# Patient Record
Sex: Female | Born: 1965 | Race: Black or African American | Hispanic: No | Marital: Single | Smoking: Never smoker
Health system: Southern US, Community
[De-identification: ages and names within clinical notes are randomized; demographics above are authoritative.]

## PROBLEM LIST (undated history)

## (undated) DIAGNOSIS — I8390 Asymptomatic varicose veins of unspecified lower extremity: Secondary | ICD-10-CM

## (undated) DIAGNOSIS — E538 Deficiency of other specified B group vitamins: Secondary | ICD-10-CM

## (undated) DIAGNOSIS — E669 Obesity, unspecified: Secondary | ICD-10-CM

## (undated) DIAGNOSIS — M722 Plantar fascial fibromatosis: Secondary | ICD-10-CM

## (undated) DIAGNOSIS — J309 Allergic rhinitis, unspecified: Secondary | ICD-10-CM

## (undated) DIAGNOSIS — I10 Essential (primary) hypertension: Secondary | ICD-10-CM

## (undated) HISTORY — DX: Plantar fascial fibromatosis: M72.2

## (undated) HISTORY — PX: WISDOM TOOTH EXTRACTION: SHX21

## (undated) HISTORY — DX: Essential (primary) hypertension: I10

## (undated) HISTORY — DX: Deficiency of other specified B group vitamins: E53.8

## (undated) HISTORY — DX: Obesity, unspecified: E66.9

## (undated) HISTORY — DX: Asymptomatic varicose veins of unspecified lower extremity: I83.90

## (undated) HISTORY — DX: Allergic rhinitis, unspecified: J30.9

---

## 1997-05-06 ENCOUNTER — Other Ambulatory Visit: Admission: RE | Admit: 1997-05-06 | Discharge: 1997-05-06 | Payer: Self-pay | Admitting: Obstetrics & Gynecology

## 1998-04-18 ENCOUNTER — Other Ambulatory Visit: Admission: RE | Admit: 1998-04-18 | Discharge: 1998-04-18 | Payer: Self-pay | Admitting: Obstetrics and Gynecology

## 1999-05-07 ENCOUNTER — Other Ambulatory Visit: Admission: RE | Admit: 1999-05-07 | Discharge: 1999-05-07 | Payer: Self-pay | Admitting: Obstetrics and Gynecology

## 1999-09-21 ENCOUNTER — Emergency Department (HOSPITAL_COMMUNITY): Admission: EM | Admit: 1999-09-21 | Discharge: 1999-09-21 | Payer: Self-pay | Admitting: *Deleted

## 1999-10-01 ENCOUNTER — Emergency Department (HOSPITAL_COMMUNITY): Admission: EM | Admit: 1999-10-01 | Discharge: 1999-10-01 | Payer: Self-pay | Admitting: *Deleted

## 2000-05-19 ENCOUNTER — Other Ambulatory Visit: Admission: RE | Admit: 2000-05-19 | Discharge: 2000-05-19 | Payer: Self-pay | Admitting: Obstetrics and Gynecology

## 2001-06-14 ENCOUNTER — Other Ambulatory Visit: Admission: RE | Admit: 2001-06-14 | Discharge: 2001-06-14 | Payer: Self-pay | Admitting: Obstetrics and Gynecology

## 2002-07-06 ENCOUNTER — Other Ambulatory Visit: Admission: RE | Admit: 2002-07-06 | Discharge: 2002-07-06 | Payer: Self-pay | Admitting: Obstetrics and Gynecology

## 2003-10-25 ENCOUNTER — Other Ambulatory Visit: Admission: RE | Admit: 2003-10-25 | Discharge: 2003-10-25 | Payer: Self-pay | Admitting: Obstetrics and Gynecology

## 2004-12-16 ENCOUNTER — Other Ambulatory Visit: Admission: RE | Admit: 2004-12-16 | Discharge: 2004-12-16 | Payer: Self-pay | Admitting: Obstetrics and Gynecology

## 2006-02-03 ENCOUNTER — Ambulatory Visit (HOSPITAL_COMMUNITY): Admission: RE | Admit: 2006-02-03 | Discharge: 2006-02-03 | Payer: Self-pay | Admitting: Obstetrics and Gynecology

## 2007-02-07 ENCOUNTER — Ambulatory Visit (HOSPITAL_COMMUNITY): Admission: RE | Admit: 2007-02-07 | Discharge: 2007-02-07 | Payer: Self-pay | Admitting: Obstetrics and Gynecology

## 2008-03-21 ENCOUNTER — Ambulatory Visit (HOSPITAL_COMMUNITY): Admission: RE | Admit: 2008-03-21 | Discharge: 2008-03-21 | Payer: Self-pay | Admitting: Obstetrics and Gynecology

## 2008-05-31 ENCOUNTER — Encounter (INDEPENDENT_AMBULATORY_CARE_PROVIDER_SITE_OTHER): Payer: Self-pay | Admitting: Podiatry

## 2008-05-31 ENCOUNTER — Ambulatory Visit: Payer: Self-pay | Admitting: Vascular Surgery

## 2008-05-31 ENCOUNTER — Ambulatory Visit (HOSPITAL_COMMUNITY): Admission: RE | Admit: 2008-05-31 | Discharge: 2008-05-31 | Payer: Self-pay | Admitting: Podiatry

## 2009-02-11 ENCOUNTER — Encounter: Admission: RE | Admit: 2009-02-11 | Discharge: 2009-02-11 | Payer: Self-pay | Admitting: Orthopedic Surgery

## 2009-03-10 ENCOUNTER — Ambulatory Visit: Payer: Self-pay | Admitting: Surgery

## 2009-03-17 ENCOUNTER — Ambulatory Visit: Payer: Self-pay | Admitting: Surgery

## 2009-03-17 ENCOUNTER — Encounter: Admission: RE | Admit: 2009-03-17 | Discharge: 2009-03-17 | Payer: Self-pay | Admitting: Surgery

## 2009-03-25 ENCOUNTER — Ambulatory Visit (HOSPITAL_COMMUNITY): Admission: RE | Admit: 2009-03-25 | Discharge: 2009-03-25 | Payer: Self-pay | Admitting: Obstetrics and Gynecology

## 2010-04-21 ENCOUNTER — Other Ambulatory Visit (HOSPITAL_COMMUNITY): Payer: Self-pay | Admitting: Obstetrics and Gynecology

## 2010-04-21 DIAGNOSIS — Z1231 Encounter for screening mammogram for malignant neoplasm of breast: Secondary | ICD-10-CM

## 2010-05-26 NOTE — Procedures (Signed)
DUPLEX DEEP VENOUS EXAM - LOWER EXTREMITY   INDICATION:  Left lower extremity edema.   HISTORY:  Edema:  Yes.  Trauma/Surgery:  No.  Pain:  No.  PE:  No.  Previous DVT:  No.  Anticoagulants:  Other:   DUPLEX EXAM:                CFV   SFV   PopV  PTV    GSV                R  L  R  L  R  L  R   L  R  L  Thrombosis    o  o     o     o      o     o  Spontaneous   +  +     +     +      +     +  Phasic        +  +     +     +      +     +  Augmentation  +  +     +     +      +     +  Compressible  +  +     +     +      +     +  Competent     +  +     +     +      +     +   Legend:  + - yes  o - no  p - partial  D - decreased   IMPRESSION:  1. No evidence of deep venous thrombosis and/or reflux noted in the      left leg.  2. Bilateral common femoral veins appear to have normal phasic flow.    _____________________________  V. Charlena Cross, MD   MG/MEDQ  D:  03/10/2009  T:  03/11/2009  Job:  [id number]

## 2010-05-26 NOTE — Assessment & Plan Note (Signed)
OFFICE VISIT   Olivia Powell, Olivia Powell  DOB:  [birth year]                                       03/10/2009  QMVHQ#:[id number]   REFERRING PHYSICIAN:  Dr. Lestine Box.   HISTORY:  This is a very pleasant 45 year old female I am seeing at the  request of Dr. Lestine Box for evaluation of left leg swelling.  The patient  states that this began occurring in April 2010.  There were no  associated factors with this.  She denies having any trauma.  She was  initially seen by a family medicine doctor, who started her on Lasix.  She did have some improvement in the swelling with Lasix; however, due  to the polyuria, she stopped taking this.  She was then seen by a  podiatrist and ultimately Dr. Lestine Box.  She is sent over to me for  further evaluation.  She has had an ultrasound that was negative for  deep vein thrombosis.  She has also been fitted for knee-high  compression stockings, which do help.  There are no history of blood  clots in the family.   REVIEW OF SYSTEMS:  GENERAL:  Positive for recent weight gain.  CARDIAC:  Negative.  PULMONARY:  Positive for cough.  GI:  Positive for reflux, constipation.  GU:  Negative.  VASCULAR:  Positive for pain in her feet when lying flat.  NEURO:  Negative.  MUSCULOSKELETAL:  Negative.  PSYCH:  Negative.  ENT:  Negative.  HEME:  Negative.  SKIN:  Rash in her neck and arm.   PAST MEDICAL HISTORY:  Left leg swelling.   PAST SURGICAL HISTORY:  Wisdom teeth in 1988.   FAMILY HISTORY:  Negative for cardiovascular disease at an early age.  Her father did have a coronary stent placed.   SOCIAL HISTORY:  She is single without children.  She is a Engineer, building services.  Does not smoke, does not drink.   PHYSICAL EXAMINATION:  Heart rate 78, blood pressure 127/87, temperature  is 98.2.  general:  She is well-appearing in no distress.  HEENT:  Within normal limits.  Lungs are clear bilaterally.  Cardiovascular:  Regular rate and rhythm.   Abdomen is obese.  Musculoskeletal:  She has  significant pitting edema in her left leg.  Both feet are warm and well-  perfused.  There is minimal swelling in the right leg.  Skin:  Without  rash.   DIAGNOSTIC STUDIES:  Venous duplex was performed today.  There is no  evidence of DVT.  There is no evidence of reflux in her deep or  superficial systems on the left.  There is normal phasic flow in the  left common femoral vein.   ASSESSMENT:  Left leg swelling.   PLAN:  In talking with the patient, we discussed that left leg swelling  could potentially represent May-Thurner syndrome.  The best way to  evaluate for this would be a CT venogram.  The other possible  explanations would be venous reflux.  She did undergo a reflux  examination today, which was normal.  Interestingly, her waveforms in  the common femoral vein were also normal, which would argue against May-  Thurner syndrome.  Regardless, I think this needs to be excluded.  If  there is a compression of the left iliac vein from the right iliac  artery, she would be  a candidate for an intervention.  In the meantime,  I have encouraged her to wear her stockings and to keep her leg elevated  whenever possible.  We will get the CT scan scheduled, and she will see  me back in a month.     Jorge Ny, MD  Electronically Signed   VWB/MEDQ  D:  03/10/2009  T:  03/11/2009  Job:  [id number]   cc:   Dr. Lestine Box

## 2010-05-26 NOTE — Assessment & Plan Note (Signed)
OFFICE VISIT   ICESIS, RENN  DOB:  07/08/1965                                       03/17/2009  JYNWG#:[id number]   Patient returns today for further follow-up of her left leg swelling.  Again, she has been having trouble with left leg swelling since April,  2010.  She has tried Lasix.  She has tried compression stockings with  compression stockings that provided her with some benefit.  In the  office last week, she underwent a venous duplex which was negative for  reflux.  Since it was limited to her left leg, I was entertaining the  possibility of May-Thurner syndrome.  She did not have an obstructive  wave pattern in her left femoral vein; however, I felt it would be best  to order a CT scan.  She comes back in today to discuss the results.  She has had no change in her symptoms.   PHYSICAL EXAMINATION:  Heart rate 92, blood pressure 134/82, temperature  is 98.0.  General:  She is well-appearing in no distress.  Respirations  are nonlabored.  Left leg continues to show edema up to the knee.   DIAGNOSTIC STUDIES:  I have independently reviewed her CT scan.  This  was a normal scan.  There was no evidence of May-Thurner syndrome.   ASSESSMENT/PLAN:  Left leg swelling.   PLAN:  I think based on the workup this most likely represents  lymphedema.  It could have been caused by trauma.  However, there was no  traumatic event that could be documented.  In any event, we discussed  putting her on lifelong compression therapy.  I am going to refer to a  lymphedema therapist.  I have told her the importance of wearing her  compression stockings to prevent long-term complications such as  ulceration.  She will follow with me on a p.r.n. basis.     Jorge Ny, MD  Electronically Signed   VWB/MEDQ  D:  03/17/2009  T:  03/18/2009  Job:  [id number]   cc:   Dr. Lestine Box

## 2010-06-04 ENCOUNTER — Ambulatory Visit (HOSPITAL_COMMUNITY)
Admission: RE | Admit: 2010-06-04 | Discharge: 2010-06-04 | Disposition: A | Discharge status: Home / Self Care | Payer: BC Managed Care – PPO | Source: Ambulatory Visit | Attending: Obstetrics and Gynecology | Admitting: Obstetrics and Gynecology

## 2010-06-04 DIAGNOSIS — Z1231 Encounter for screening mammogram for malignant neoplasm of breast: Secondary | ICD-10-CM | POA: Insufficient documentation

## 2010-06-28 IMAGING — CT CT ANGIO AOBIFEM WO/W CM
2 of 9 series · 9 of 33 positions shown · IV contrast ([ID] OMNI 350)
Comparison: None.

CLINICAL DATA: Chronic edema of left lower extremity.  Evaluate
for Nilsen syndrome.

CT ANGIOGRAPHY OF ABDOMINAL AORTA WITH ILIOFEMORAL RUNOFF
TECHNIQUE: Multidetector CT imaging of the abdomen, pelvis and
lower extremities was performed using the standard protocol during
bolus administration of intravenous contrast.  Multiplanar CT image
reconstructions including MIPs were obtained to evaluate the
vascular anatomy.  Given clinical indication, both arterial and
venous phase imaging was performed with reconstructions performed
for both phases.
Contrast:  165 ml Omnipaque 350 IV

[Series 5: arterial, venous · axial · arterial · 0.82mm/px · z∈[-1151,-161]mm · 7 of 416 slices shown]
[im 52/416  soft-tissue]
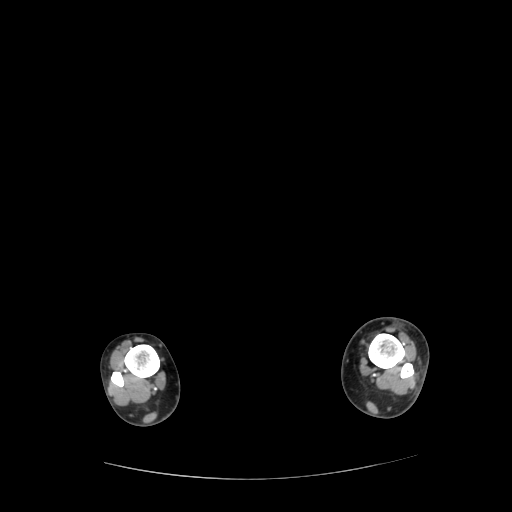
[im 104/416  bone]
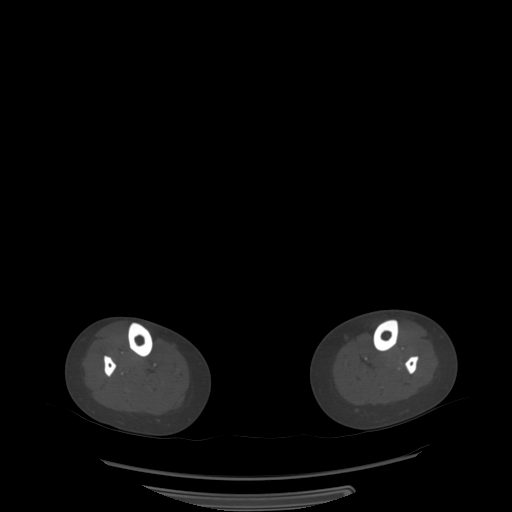
[im 156/416  soft-tissue]
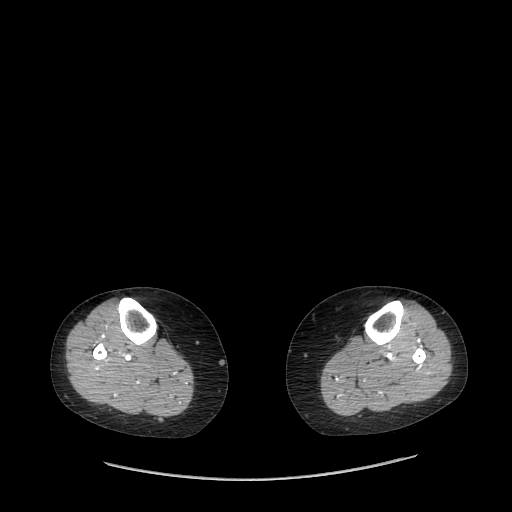
[im 208/416  bone]
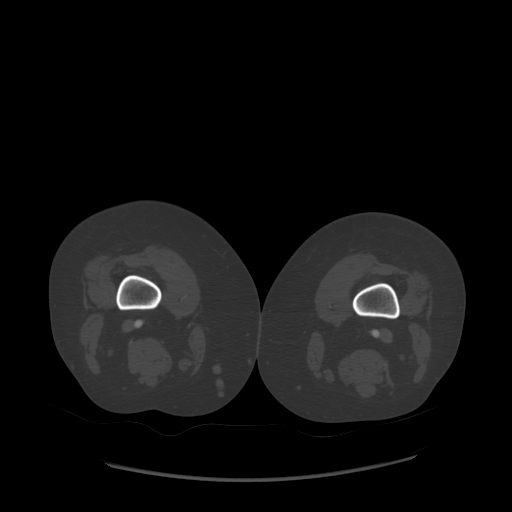
[im 260/416  soft-tissue]
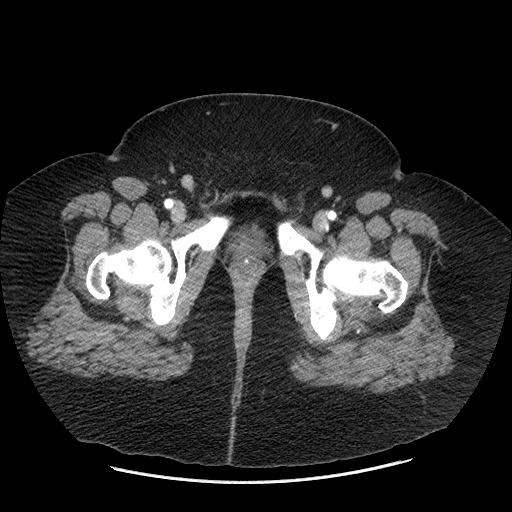
[im 312/416  bone]
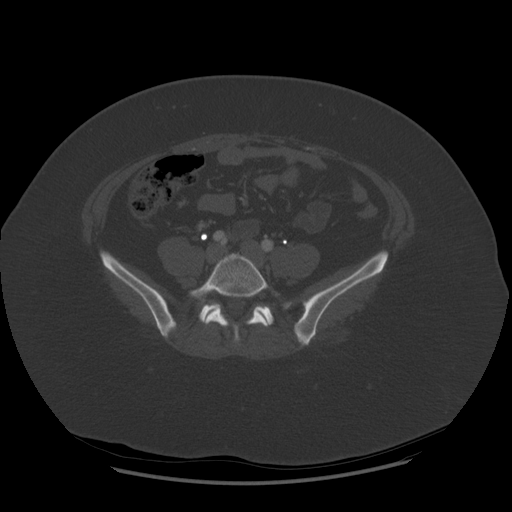
[im 364/416  soft-tissue]
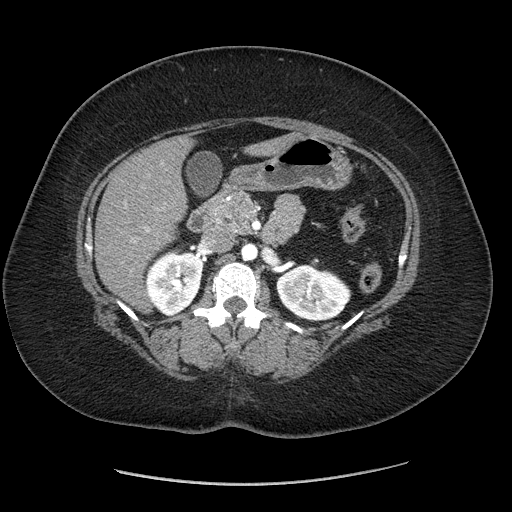

[Series 8: venous · axial · portal-venous · 0.82mm/px · z∈[-470,-315]mm · 2 of 187 slices shown]
[im 63/187  soft-tissue]
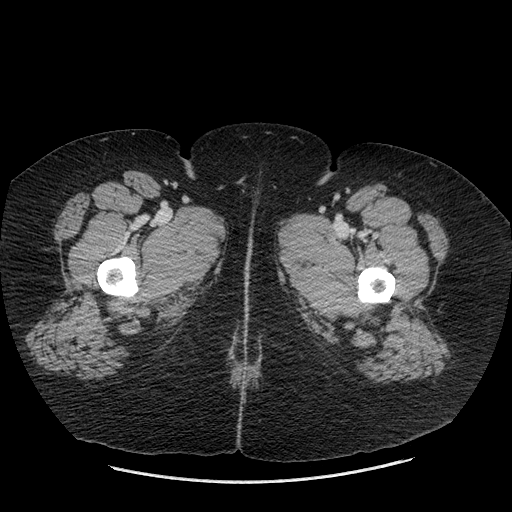
[im 125/187  soft-tissue]
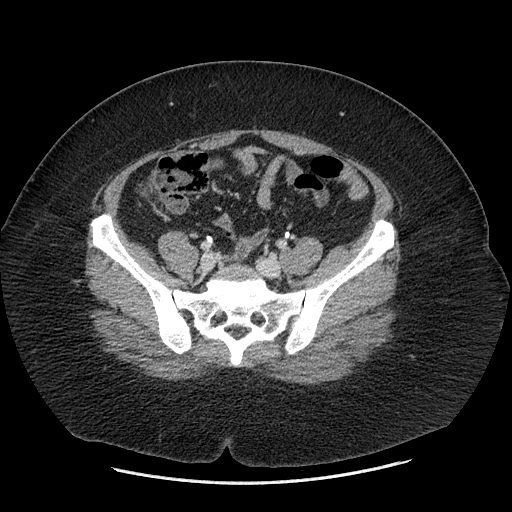

[9 of 33 positions shown; findings below may reference images not displayed]

FINDINGS: Aorta:  The abdominal aorta is normally patent.  Proximal celiac
axis shows minimal narrowing of 25 - 30%.  The superior mesenteric
artery, bilateral single renal arteries and inferior mesenteric
artery show normal patency.

The inferior vena cava is normally patent.

Right Lower Extremity:  Arterial evaluation demonstrates normally
patent iliac arteries, common femoral artery, superficial femoral
artery, profunda femoral artery and popliteal artery.  Normal three-
vessel runoff is demonstrated below the knee.

Venous evaluation shows normally patent iliac veins, femoral veins
and popliteal veins.  No evidence of thrombus or venous stenosis.

Left Lower Extremity:  Arterial evaluation demonstrates normally
patent iliac arteries, common femoral artery, superficial femoral
artery, profunda femoral artery and popliteal artery.  Normal three-
vessel runoff is demonstrated below the knee.

Venous evaluation shows normally patent iliac veins, femoral veins
and popliteal veins.  No evidence of thrombus or venous stenosis.
There is no evidence of Nilsen syndrome.  The left common
iliac vein is well delineated and shows no stenosis proximally
where it to crosses between the right common iliac artery and the
spine.  There is no evidence of iliofemoral DVT.

Review of the MIP images confirms the above findings.
IMPRESSION: Normal CTA examination demonstrating normal arteries and veins
supplying the lower extremities bilaterally.  There is no evidence
of Nilsen anatomy or iliofemoral DVT.

## 2011-03-09 ENCOUNTER — Encounter: Payer: Self-pay | Admitting: Obstetrics and Gynecology

## 2011-03-09 ENCOUNTER — Other Ambulatory Visit (INDEPENDENT_AMBULATORY_CARE_PROVIDER_SITE_OTHER): Payer: BC Managed Care – PPO

## 2011-03-09 ENCOUNTER — Encounter (INDEPENDENT_AMBULATORY_CARE_PROVIDER_SITE_OTHER): Payer: BC Managed Care – PPO | Admitting: Obstetrics and Gynecology

## 2011-03-09 DIAGNOSIS — E669 Obesity, unspecified: Secondary | ICD-10-CM

## 2011-03-09 DIAGNOSIS — N912 Amenorrhea, unspecified: Secondary | ICD-10-CM

## 2011-04-19 ENCOUNTER — Other Ambulatory Visit (HOSPITAL_COMMUNITY): Payer: Self-pay | Admitting: Internal Medicine

## 2011-04-19 DIAGNOSIS — Z1231 Encounter for screening mammogram for malignant neoplasm of breast: Secondary | ICD-10-CM

## 2011-06-14 ENCOUNTER — Ambulatory Visit (HOSPITAL_COMMUNITY)
Admission: RE | Admit: 2011-06-14 | Discharge: 2011-06-14 | Disposition: A | Discharge status: Home / Self Care | Payer: BC Managed Care – PPO | Source: Ambulatory Visit | Attending: Internal Medicine | Admitting: Internal Medicine

## 2011-06-14 DIAGNOSIS — Z1231 Encounter for screening mammogram for malignant neoplasm of breast: Secondary | ICD-10-CM | POA: Insufficient documentation

## 2012-03-24 ENCOUNTER — Encounter: Payer: Self-pay | Admitting: Gastroenterology

## 2012-04-03 ENCOUNTER — Ambulatory Visit: Payer: BC Managed Care – PPO | Admitting: Gastroenterology

## 2012-05-25 ENCOUNTER — Other Ambulatory Visit (HOSPITAL_COMMUNITY): Payer: Self-pay | Admitting: Internal Medicine

## 2012-05-25 DIAGNOSIS — Z1231 Encounter for screening mammogram for malignant neoplasm of breast: Secondary | ICD-10-CM

## 2012-06-20 ENCOUNTER — Ambulatory Visit (HOSPITAL_COMMUNITY)
Admission: RE | Admit: 2012-06-20 | Discharge: 2012-06-20 | Disposition: A | Discharge status: Home / Self Care | Payer: BC Managed Care – PPO | Source: Ambulatory Visit | Attending: Internal Medicine | Admitting: Internal Medicine

## 2012-06-20 DIAGNOSIS — Z1231 Encounter for screening mammogram for malignant neoplasm of breast: Secondary | ICD-10-CM

## 2013-04-24 ENCOUNTER — Encounter: Payer: BC Managed Care – PPO | Attending: Internal Medicine | Admitting: Dietician

## 2013-04-24 ENCOUNTER — Encounter: Payer: Self-pay | Admitting: Dietician

## 2013-04-24 VITALS — Ht 63.0 in | Wt 261.1 lb

## 2013-04-24 DIAGNOSIS — E669 Obesity, unspecified: Secondary | ICD-10-CM

## 2013-04-24 DIAGNOSIS — Z6841 Body Mass Index (BMI) 40.0 and over, adult: Secondary | ICD-10-CM | POA: Insufficient documentation

## 2013-04-24 DIAGNOSIS — Z713 Dietary counseling and surveillance: Secondary | ICD-10-CM | POA: Insufficient documentation

## 2013-04-24 DIAGNOSIS — R635 Abnormal weight gain: Secondary | ICD-10-CM | POA: Insufficient documentation

## 2013-04-24 NOTE — Progress Notes (Signed)
  Medical Nutrition Therapy:  Appt start time: 1730 end time:  1845.   Assessment:  Primary concerns today: Olivia Powell is here today since she had a physical a month ago has noticed that her weight keeps going up. [facility] but would like to have help establishing a meal planning. Often skips breakfast and picks up fast food salad at dinner. Gained about 40-50 lbs in the past 5-6 years. Was around 200 lbs at age 48. Feels like she is not eating when she should - skipping meals or prolonging meals.   Olivia Powell works for [employer] company and lives by herself. Eats lunch and dinner from restaurants (one is heavier than the other).   Has gotten "addicted" to [employer] nuggets/ice cream. Would like to lose 8-10 lbs in the next 2 months.   TANITA  BODY COMP RESULTS  04/24/13   BMI (kg/m^2) 46.4   Fat Mass (lbs) 130.5   Fat Free Mass (lbs) 131.5   Total Body Water (lbs) 96.5    Preferred Learning Style:   No preference indicated   Learning Readiness:   Ready  MEDICATIONS: see list   DIETARY INTAKE:  Avoided foods include: liver, wild game   24-hr recall:  B ( AM): skips Snk ( AM): fruit or yogurt with granola L ( PM): subway 6 inch sub or Wendy's salad or [place] chicken breast with cheese and fried tortilla with sweet tea/soda Snk ( PM): not usually, may have a cookie  D ( PM): 6 inch sub or fish or Wendy's salad or bag of chip or ice cream/popsicles (snacking) with tea, lemonade juice Snk ( PM): snacking Beverages: 1 soda per day, sweet tea, juice (lemonade or cranapple)  Usual physical activity: walking - parking further away   Estimated energy needs: 1800 calories 200 g carbohydrates 135 g protein 50 g fat  Progress Towards Goal(s):  In progress.   Nutritional Diagnosis:  [place]-3.3 Overweight/obesity As related to excess consumptions of sweets, frequent snacking, and inadequate physical activity.  As evidenced by BMI of 46.3.     Intervention:  Nutrition counseling provided. Plan: Try to drink mostly water or hot tea with stevia or sparkling water. Work your way up to 30 minutes of activity 5 x week.  Fill up half of your plate with vegetables (bagged lettuce or frozen).  Consider not buying sweets or chips or anything tempting. For snacks, try low fat popcorn, fruit, cheese, guacamole with triscuits, or peanut butter. Work on eating breakfast each day. Try toast with peanut butter, oatmeal, yogurt, fruit, or an egg sandwich. Think about planning out meals on on weekends when you have more time. Consider going to sleep a little earlier to help have more energy the next day.     Teaching Method Utilized:  Visual Auditory Hands on  Handouts given during visit include:  MyPlate Handout  15 g CHO Snacks  Barriers to learning/adherence to lifestyle change: none  Demonstrated degree of understanding via:  Teach Back   Monitoring/Evaluation:  Dietary intake, exercise, and body weight in 2 month(s).

## 2013-04-24 NOTE — Patient Instructions (Signed)
Try to drink mostly water or hot tea with stevia or sparkling water. Work your way up to 30 minutes of activity 5 x week.  Fill up half of your plate with vegetables (bagged lettuce or frozen).  Consider not buying sweets or chips or anything tempting. For snacks, try low fat popcorn, fruit, cheese, guacamole with triscuits, or peanut butter. Work on eating breakfast each day. Try toast with peanut butter, oatmeal, yogurt, fruit, or an egg sandwich. Think about planning out meals on on weekends when you have more time. Consider going to sleep a little earlier to help have more energy the next day.

## 2013-06-25 ENCOUNTER — Ambulatory Visit: Payer: BC Managed Care – PPO | Admitting: Dietician

## 2013-07-04 ENCOUNTER — Other Ambulatory Visit (HOSPITAL_COMMUNITY): Payer: Self-pay | Admitting: Obstetrics and Gynecology

## 2013-07-04 DIAGNOSIS — Z1231 Encounter for screening mammogram for malignant neoplasm of breast: Secondary | ICD-10-CM

## 2013-07-10 ENCOUNTER — Ambulatory Visit (HOSPITAL_COMMUNITY)
Admission: RE | Admit: 2013-07-10 | Discharge: 2013-07-10 | Disposition: A | Discharge status: Home / Self Care | Payer: BC Managed Care – PPO | Source: Ambulatory Visit | Attending: Obstetrics and Gynecology | Admitting: Obstetrics and Gynecology

## 2013-07-10 DIAGNOSIS — Z1231 Encounter for screening mammogram for malignant neoplasm of breast: Secondary | ICD-10-CM | POA: Insufficient documentation

## 2014-08-21 ENCOUNTER — Encounter: Payer: Self-pay | Admitting: Gastroenterology

## 2014-10-29 ENCOUNTER — Ambulatory Visit: Payer: Self-pay | Admitting: Gastroenterology

## 2016-09-14 ENCOUNTER — Other Ambulatory Visit: Payer: Self-pay | Admitting: Obstetrics and Gynecology

## 2016-09-14 DIAGNOSIS — Z1231 Encounter for screening mammogram for malignant neoplasm of breast: Secondary | ICD-10-CM

## 2016-09-17 ENCOUNTER — Ambulatory Visit: Payer: Self-pay

## 2016-09-21 DIAGNOSIS — Z1231 Encounter for screening mammogram for malignant neoplasm of breast: Secondary | ICD-10-CM | POA: Diagnosis not present

## 2016-09-21 DIAGNOSIS — Z6841 Body Mass Index (BMI) 40.0 and over, adult: Secondary | ICD-10-CM | POA: Diagnosis not present

## 2016-09-21 DIAGNOSIS — Z01419 Encounter for gynecological examination (general) (routine) without abnormal findings: Secondary | ICD-10-CM | POA: Diagnosis not present

## 2016-09-21 DIAGNOSIS — Z124 Encounter for screening for malignant neoplasm of cervix: Secondary | ICD-10-CM | POA: Diagnosis not present

## 2016-10-05 DIAGNOSIS — K219 Gastro-esophageal reflux disease without esophagitis: Secondary | ICD-10-CM | POA: Diagnosis not present

## 2016-10-05 DIAGNOSIS — Z1211 Encounter for screening for malignant neoplasm of colon: Secondary | ICD-10-CM | POA: Diagnosis not present

## 2016-11-02 DIAGNOSIS — I10 Essential (primary) hypertension: Secondary | ICD-10-CM | POA: Diagnosis not present

## 2016-11-02 DIAGNOSIS — E538 Deficiency of other specified B group vitamins: Secondary | ICD-10-CM | POA: Diagnosis not present

## 2016-11-02 DIAGNOSIS — R7309 Other abnormal glucose: Secondary | ICD-10-CM | POA: Diagnosis not present

## 2016-11-02 DIAGNOSIS — Z1389 Encounter for screening for other disorder: Secondary | ICD-10-CM | POA: Diagnosis not present

## 2016-11-02 DIAGNOSIS — J3089 Other allergic rhinitis: Secondary | ICD-10-CM | POA: Diagnosis not present

## 2016-11-02 DIAGNOSIS — Z0000 Encounter for general adult medical examination without abnormal findings: Secondary | ICD-10-CM | POA: Diagnosis not present

## 2016-11-08 DIAGNOSIS — D1801 Hemangioma of skin and subcutaneous tissue: Secondary | ICD-10-CM | POA: Diagnosis not present

## 2016-11-08 DIAGNOSIS — L918 Other hypertrophic disorders of the skin: Secondary | ICD-10-CM | POA: Diagnosis not present

## 2016-11-08 DIAGNOSIS — B078 Other viral warts: Secondary | ICD-10-CM | POA: Diagnosis not present

## 2016-11-22 DIAGNOSIS — Z1211 Encounter for screening for malignant neoplasm of colon: Secondary | ICD-10-CM | POA: Diagnosis not present

## 2017-09-26 DIAGNOSIS — Z1231 Encounter for screening mammogram for malignant neoplasm of breast: Secondary | ICD-10-CM | POA: Diagnosis not present

## 2017-09-29 DIAGNOSIS — Z6841 Body Mass Index (BMI) 40.0 and over, adult: Secondary | ICD-10-CM | POA: Diagnosis not present

## 2017-09-29 DIAGNOSIS — Z113 Encounter for screening for infections with a predominantly sexual mode of transmission: Secondary | ICD-10-CM | POA: Diagnosis not present

## 2017-09-29 DIAGNOSIS — Z01419 Encounter for gynecological examination (general) (routine) without abnormal findings: Secondary | ICD-10-CM | POA: Diagnosis not present

## 2017-09-29 DIAGNOSIS — Z1231 Encounter for screening mammogram for malignant neoplasm of breast: Secondary | ICD-10-CM | POA: Diagnosis not present

## 2018-01-06 DIAGNOSIS — Z0001 Encounter for general adult medical examination with abnormal findings: Secondary | ICD-10-CM | POA: Diagnosis not present

## 2018-01-09 DIAGNOSIS — Z0001 Encounter for general adult medical examination with abnormal findings: Secondary | ICD-10-CM | POA: Diagnosis not present

## 2018-01-09 DIAGNOSIS — Z01419 Encounter for gynecological examination (general) (routine) without abnormal findings: Secondary | ICD-10-CM | POA: Diagnosis not present

## 2018-01-09 DIAGNOSIS — Z23 Encounter for immunization: Secondary | ICD-10-CM | POA: Diagnosis not present

## 2018-01-09 DIAGNOSIS — Z136 Encounter for screening for cardiovascular disorders: Secondary | ICD-10-CM | POA: Diagnosis not present

## 2018-10-12 ENCOUNTER — Other Ambulatory Visit: Payer: Self-pay | Admitting: Occupational Medicine

## 2018-10-12 LAB — COMPLETE METABOLIC PANEL WITH GFR
AG Ratio: 1.2 (calc) (ref 1.0–2.5)
ALT: 11 U/L (ref 6–29)
AST: 16 U/L (ref 10–35)
Albumin: 4 g/dL (ref 3.6–5.1)
Alkaline phosphatase (APISO): 49 U/L (ref 37–153)
BUN: 12 mg/dL (ref 7–25)
CO2: 26 mmol/L (ref 20–32)
Calcium: 9.6 mg/dL (ref 8.6–10.4)
Chloride: 106 mmol/L (ref 98–110)
Creat: 0.93 mg/dL (ref 0.50–1.05)
GFR, Est African American: 82 mL/min/{1.73_m2} (ref >=60)
GFR, Est Non African American: 71 mL/min/{1.73_m2} (ref >=60)
Globulin: 3.3 g/dL (calc) (ref 1.9–3.7)
Glucose, Bld: 95 mg/dL (ref 65–99)
Potassium: 4.2 mmol/L (ref 3.5–5.3)
Sodium: 142 mmol/L (ref 135–146)
Total Bilirubin: 0.6 mg/dL (ref 0.2–1.2)
Total Protein: 7.3 g/dL (ref 6.1–8.1)

## 2018-10-12 LAB — CBC WITH DIFFERENTIAL/PLATELET
Absolute Monocytes: 348 cells/uL (ref 200–950)
Basophils Absolute: 19 cells/uL (ref 0–200)
Basophils Relative: 0.4 %
Eosinophils Absolute: 89 cells/uL (ref 15–500)
Eosinophils Relative: 1.9 %
HCT: 35.8 % (ref 35.0–45.0)
Hemoglobin: 11.8 g/dL (ref 11.7–15.5)
Lymphs Abs: 1838 cells/uL (ref 850–3900)
MCH: 29.1 pg (ref 27.0–33.0)
MCHC: 33 g/dL (ref 32.0–36.0)
MCV: 88.4 fL (ref 80.0–100.0)
MPV: 9.2 fL (ref 7.5–12.5)
Monocytes Relative: 7.4 %
Neutro Abs: 2406 cells/uL (ref 1500–7800)
Neutrophils Relative %: 51.2 %
Platelets: 314 10*3/uL (ref 140–400)
RBC: 4.05 10*6/uL (ref 3.80–5.10)
RDW: 12.4 % (ref 11.0–15.0)
Total Lymphocyte: 39.1 %
WBC: 4.7 10*3/uL (ref 3.8–10.8)

## 2018-10-12 LAB — LIPID PANEL
Cholesterol: 168 mg/dL (ref <200)
HDL: 64 mg/dL (ref >=50)
LDL Cholesterol (Calc): 91 mg/dL (calc)
Non-HDL Cholesterol (Calc): 104 mg/dL (calc) (ref <130)
Total CHOL/HDL Ratio: 2.6 (calc) (ref <5.0)
Triglycerides: 52 mg/dL (ref <150)

## 2018-10-12 LAB — TSH: TSH: 1.47 mIU/L

## 2019-04-20 ENCOUNTER — Other Ambulatory Visit: Payer: Self-pay | Admitting: Internal Medicine

## 2019-04-20 DIAGNOSIS — Z1231 Encounter for screening mammogram for malignant neoplasm of breast: Secondary | ICD-10-CM

## 2019-04-26 ENCOUNTER — Other Ambulatory Visit: Payer: Self-pay

## 2019-04-26 ENCOUNTER — Ambulatory Visit
Admission: RE | Admit: 2019-04-26 | Discharge: 2019-04-26 | Disposition: A | Discharge status: Home / Self Care | Payer: 59 | Source: Ambulatory Visit | Attending: Internal Medicine | Admitting: Internal Medicine

## 2019-04-26 DIAGNOSIS — Z1231 Encounter for screening mammogram for malignant neoplasm of breast: Secondary | ICD-10-CM

## 2019-12-10 ENCOUNTER — Ambulatory Visit: Payer: 59 | Attending: Internal Medicine

## 2019-12-10 DIAGNOSIS — Z23 Encounter for immunization: Secondary | ICD-10-CM

## 2019-12-10 NOTE — Progress Notes (Signed)
   Covid-19 Vaccination Clinic  Name:  Olivia Powell    MRN: [id number] DOB: [birth year]  12/10/2019  [name] was observed post Covid-19 immunization for 15 minutes without incident. She was provided with Vaccine Information Sheet and instruction to access the V-Safe system.   [name] was instructed to call 911 with any severe reactions post vaccine: Marland Kitchen Difficulty breathing  . Swelling of face and throat  . A fast heartbeat  . A bad rash all over body  . Dizziness and weakness   Immunizations Administered    Name Date Dose VIS Date Route   Pfizer COVID-19 Vaccine 12/10/2019  1:16 PM 0.3 mL 10/31/2019 Intramuscular   Manufacturer: [employer], [employer]   Lot: [id number]   NDC: [address]-1

## 2020-02-06 ENCOUNTER — Other Ambulatory Visit: Payer: Self-pay | Admitting: Internal Medicine

## 2020-02-06 DIAGNOSIS — N182 Chronic kidney disease, stage 2 (mild): Secondary | ICD-10-CM

## 2020-02-22 ENCOUNTER — Ambulatory Visit
Admission: RE | Admit: 2020-02-22 | Discharge: 2020-02-22 | Disposition: A | Discharge status: Home / Self Care | Payer: 59 | Source: Ambulatory Visit | Attending: Internal Medicine | Admitting: Internal Medicine

## 2020-02-22 DIAGNOSIS — N182 Chronic kidney disease, stage 2 (mild): Secondary | ICD-10-CM

## 2020-05-09 ENCOUNTER — Other Ambulatory Visit: Payer: Self-pay | Admitting: Internal Medicine

## 2020-05-09 DIAGNOSIS — Z1231 Encounter for screening mammogram for malignant neoplasm of breast: Secondary | ICD-10-CM

## 2020-07-03 ENCOUNTER — Ambulatory Visit
Admission: RE | Admit: 2020-07-03 | Discharge: 2020-07-03 | Disposition: A | Discharge status: Home / Self Care | Payer: 59 | Source: Ambulatory Visit | Attending: Internal Medicine | Admitting: Internal Medicine

## 2020-07-03 ENCOUNTER — Other Ambulatory Visit: Payer: Self-pay

## 2020-07-03 DIAGNOSIS — Z1231 Encounter for screening mammogram for malignant neoplasm of breast: Secondary | ICD-10-CM

## 2020-07-24 ENCOUNTER — Other Ambulatory Visit (HOSPITAL_BASED_OUTPATIENT_CLINIC_OR_DEPARTMENT_OTHER): Payer: Self-pay

## 2020-07-24 ENCOUNTER — Ambulatory Visit: Payer: 59 | Attending: Internal Medicine

## 2020-07-24 ENCOUNTER — Other Ambulatory Visit: Payer: Self-pay

## 2020-07-24 DIAGNOSIS — Z23 Encounter for immunization: Secondary | ICD-10-CM

## 2020-07-24 MED ORDER — PFIZER-BIONT COVID-19 VAC-TRIS 30 MCG/0.3ML IM SUSP
INTRAMUSCULAR | 0 refills | Status: AC
  Filled 2020-07-24: qty 0.3, 1d supply, fill #0

## 2020-07-24 NOTE — Progress Notes (Signed)
   Covid-19 Vaccination Clinic  Name:  Olivia Powell    MRN: [id number] DOB: Sep 02, 1965  07/24/2020  [name] was observed post Covid-19 immunization for 15 minutes without incident. She was provided with Vaccine Information Sheet and instruction to access the V-Safe system.   [name] was instructed to call 911 with any severe reactions post vaccine: Difficulty breathing  Swelling of face and throat  A fast heartbeat  A bad rash all over body  Dizziness and weakness   Immunizations Administered     Name Date Dose VIS Date Route   PFIZER Comrnaty(Gray TOP) Covid-19 Vaccine 07/24/2020  2:00 PM 0.3 mL 12/20/2019 Intramuscular   Manufacturer: [employer], [employer]   Lot: [id number]   NDC: [id number]

## 2020-07-28 ENCOUNTER — Other Ambulatory Visit (HOSPITAL_BASED_OUTPATIENT_CLINIC_OR_DEPARTMENT_OTHER): Payer: Self-pay

## 2020-07-29 ENCOUNTER — Other Ambulatory Visit (HOSPITAL_BASED_OUTPATIENT_CLINIC_OR_DEPARTMENT_OTHER): Payer: Self-pay

## 2020-07-29 MED ORDER — HYDROCHLOROTHIAZIDE 12.5 MG PO CAPS
12.5000 mg | ORAL_CAPSULE | Freq: Every day | ORAL | 0 refills | Status: AC
  Filled 2020-07-29: qty 30, 30d supply, fill #0

## 2020-11-27 ENCOUNTER — Ambulatory Visit: Payer: 59 | Attending: Internal Medicine

## 2020-11-27 ENCOUNTER — Other Ambulatory Visit (HOSPITAL_BASED_OUTPATIENT_CLINIC_OR_DEPARTMENT_OTHER): Payer: Self-pay

## 2020-11-27 DIAGNOSIS — Z23 Encounter for immunization: Secondary | ICD-10-CM

## 2020-11-27 MED ORDER — PFIZER COVID-19 VAC BIVALENT 30 MCG/0.3ML IM SUSP
INTRAMUSCULAR | 0 refills | Status: AC
  Filled 2020-11-27: qty 0.3, 1d supply, fill #0

## 2020-11-27 NOTE — Progress Notes (Signed)
   Covid-19 Vaccination Clinic  Name:  Olivia Powell    MRN: [id number] DOB: Mar 30, 1965  11/27/2020  Ms. Olivia Powell was observed post Covid-19 immunization for 15 minutes without incident. She was provided with Vaccine Information Sheet and instruction to access the V-Safe system.   Ms. Olivia Powell was instructed to call 911 with any severe reactions post vaccine: Difficulty breathing  Swelling of face and throat  A fast heartbeat  A bad rash all over body  Dizziness and weakness   Immunizations Administered     Name Date Dose VIS Date Route   Pfizer Covid-19 Vaccine Bivalent Booster 11/27/2020  1:30 PM 0.3 mL 09/10/2020 Intramuscular   Manufacturer: [employer], [employer]   Lot: [id number]   NDC: [phone]

## 2020-12-10 ENCOUNTER — Ambulatory Visit: Payer: 59

## 2021-01-20 ENCOUNTER — Ambulatory Visit: Payer: Self-pay | Admitting: Podiatry

## 2021-01-30 ENCOUNTER — Ambulatory Visit (INDEPENDENT_AMBULATORY_CARE_PROVIDER_SITE_OTHER): Payer: Managed Care, Other (non HMO)

## 2021-01-30 ENCOUNTER — Ambulatory Visit: Payer: Managed Care, Other (non HMO) | Admitting: Podiatry

## 2021-01-30 ENCOUNTER — Other Ambulatory Visit: Payer: Self-pay

## 2021-01-30 ENCOUNTER — Other Ambulatory Visit (HOSPITAL_BASED_OUTPATIENT_CLINIC_OR_DEPARTMENT_OTHER): Payer: Self-pay

## 2021-01-30 DIAGNOSIS — S9032XA Contusion of left foot, initial encounter: Secondary | ICD-10-CM | POA: Diagnosis not present

## 2021-01-30 DIAGNOSIS — E2839 Other primary ovarian failure: Secondary | ICD-10-CM | POA: Insufficient documentation

## 2021-01-30 DIAGNOSIS — Z1211 Encounter for screening for malignant neoplasm of colon: Secondary | ICD-10-CM | POA: Insufficient documentation

## 2021-01-30 DIAGNOSIS — K219 Gastro-esophageal reflux disease without esophagitis: Secondary | ICD-10-CM | POA: Insufficient documentation

## 2021-01-30 DIAGNOSIS — I10 Essential (primary) hypertension: Secondary | ICD-10-CM | POA: Insufficient documentation

## 2021-01-30 DIAGNOSIS — M76822 Posterior tibial tendinitis, left leg: Secondary | ICD-10-CM

## 2021-01-30 DIAGNOSIS — B009 Herpesviral infection, unspecified: Secondary | ICD-10-CM | POA: Insufficient documentation

## 2021-01-30 MED ORDER — MELOXICAM 15 MG PO TABS
15.0000 mg | ORAL_TABLET | Freq: Every day | ORAL | 0 refills | Status: AC
  Filled 2021-01-30: qty 30, 30d supply, fill #0

## 2021-01-30 NOTE — Progress Notes (Signed)
°  Subjective:  Patient ID: Talayia Hjort, female    DOB: 04-04-1965,  MRN: [id number]  Chief Complaint  Patient presents with   Foot Pain    Pt injured left foot over 10 years ago- pt mentioned shes been having dull aches at the medical side of foot- further evaluation    56 y.o. female presents with the above complaint. History confirmed with patient. Has a CAM boot that she worse previously for this pain.  Objective:  Physical Exam: warm, good capillary refill, no trophic changes or ulcerative lesions, normal DP and PT pulses, and normal sensory exam. Left Foot: POP left medial ankle at the PTT. No warmth, erythema. Local edema. Weakness with inversion noted 4/5.  No images are attached to the encounter.  Radiographs: X-ray of the left foot: no fracture, dislocation, swelling or degenerative changes noted Assessment:   1. Contusion of left foot, initial encounter   2. Posterior tibial tendonitis, left    Plan:  Patient was evaluated and treated and all questions answered.  Tendonitis -XR reviewed with patient -Discussed liklely chronic tendon injury given mild weakness. -Rx meloxicam. Educated on R/b and uses of medication -Advised use of CAM boot for approx 3 weeks to offload the area -Order ST [place] left ankle to eval PTT -Should issues persist consider MRI  Return in about 4 weeks (around 02/27/2021) for Tendonitis.

## 2021-02-03 ENCOUNTER — Other Ambulatory Visit (HOSPITAL_BASED_OUTPATIENT_CLINIC_OR_DEPARTMENT_OTHER): Payer: Self-pay

## 2021-02-03 MED ORDER — CLOTRIMAZOLE-BETAMETHASONE 1-0.05 % EX CREA
TOPICAL_CREAM | CUTANEOUS | 1 refills | Status: AC
  Filled 2021-02-03: qty 45, 14d supply, fill #0

## 2021-02-03 MED ORDER — WEGOVY 0.25 MG/0.5ML ~~LOC~~ SOAJ
SUBCUTANEOUS | 0 refills | Status: AC
  Filled 2021-02-03 – 2021-02-06 (×4): qty 2, 28d supply, fill #0

## 2021-02-06 ENCOUNTER — Other Ambulatory Visit (HOSPITAL_BASED_OUTPATIENT_CLINIC_OR_DEPARTMENT_OTHER): Payer: Self-pay

## 2021-02-17 ENCOUNTER — Other Ambulatory Visit (HOSPITAL_BASED_OUTPATIENT_CLINIC_OR_DEPARTMENT_OTHER): Payer: Self-pay

## 2021-02-26 ENCOUNTER — Other Ambulatory Visit (HOSPITAL_BASED_OUTPATIENT_CLINIC_OR_DEPARTMENT_OTHER): Payer: Self-pay

## 2021-02-26 MED ORDER — BENZONATATE 200 MG PO CAPS
ORAL_CAPSULE | ORAL | 1 refills | Status: AC
  Filled 2021-02-26: qty 21, 7d supply, fill #0

## 2021-02-28 ENCOUNTER — Ambulatory Visit (HOSPITAL_BASED_OUTPATIENT_CLINIC_OR_DEPARTMENT_OTHER): Admission: RE | Admit: 2021-02-28 | Payer: Managed Care, Other (non HMO) | Source: Ambulatory Visit

## 2021-03-02 ENCOUNTER — Telehealth: Payer: Self-pay | Admitting: *Deleted

## 2021-03-02 NOTE — Telephone Encounter (Signed)
-----   Message from Vivi Barrack, DPM sent at 03/02/2021  8:05 AM EST ----- Regarding: FW: ultrasound issue Johari Bennetts- can you please change the order for [facility]? Thanks! ----- Message ----- From: Park Liter, DPM Sent: 03/02/2021   7:57 AM EST To: Vivi Barrack, DPM Subject: FW: ultrasound issue                            ----- Message ----- From: Marcene Brawn Sent: 02/27/2021   6:11 PM EST To: Park Liter, DPM Subject: ultrasound issue                               Hey, this patient was scheduled for her ultrasound at our facility [facility]) for tomorrow (Saturday) but called to see if she could get it done today. After reviewing the reason for exam I had to inform the patient that I am not trained in MSK ultrasound and she would have to get it done at a site with a tech that was fully trained. Not all ultrasound techs have the proper training for MSK work. I'm very sorry that this wasn't caught sooner. The patient stated she will follow up with you during her Tuesday appointment. I told the patient I would send you a message to explain the situation and the need for a tech trained in MSK ultrasound as not all of [place] are. Thank you! Sharlotte Alamo, RDMS

## 2021-03-02 NOTE — Telephone Encounter (Signed)
Faxed and called [facility] referral for MSK Ultrasound, the [facility] is the only office that performs this study but they will schedule at that location.

## 2021-03-03 ENCOUNTER — Ambulatory Visit: Payer: Managed Care, Other (non HMO) | Admitting: Podiatry

## 2021-04-20 ENCOUNTER — Other Ambulatory Visit: Payer: Self-pay | Admitting: Podiatry

## 2021-04-20 ENCOUNTER — Telehealth: Payer: Self-pay

## 2021-04-20 DIAGNOSIS — M76822 Posterior tibial tendinitis, left leg: Secondary | ICD-10-CM

## 2021-04-20 NOTE — Telephone Encounter (Signed)
Patient called the office stating she needs her referral to be send to [facility] due to her insurance. ? ? ?Please advise ?

## 2021-04-20 NOTE — Telephone Encounter (Signed)
Called patient to let her know her referral has been sent. ?

## 2021-05-05 ENCOUNTER — Other Ambulatory Visit: Payer: Managed Care, Other (non HMO)

## 2021-05-06 ENCOUNTER — Encounter (HOSPITAL_BASED_OUTPATIENT_CLINIC_OR_DEPARTMENT_OTHER): Payer: Self-pay | Admitting: Pharmacist

## 2021-05-06 ENCOUNTER — Other Ambulatory Visit (HOSPITAL_BASED_OUTPATIENT_CLINIC_OR_DEPARTMENT_OTHER): Payer: Self-pay

## 2021-05-06 MED ORDER — SODIUM FLUORIDE 0.2 % MT SOLN
OROMUCOSAL | 5 refills | Status: AC
Start: 2021-05-06 — End: ?
  Filled 2021-05-06: qty 473, 30d supply, fill #0
  Filled 2021-05-21: qty 473, 14d supply, fill #0

## 2021-05-07 ENCOUNTER — Other Ambulatory Visit (HOSPITAL_BASED_OUTPATIENT_CLINIC_OR_DEPARTMENT_OTHER): Payer: Self-pay

## 2021-05-11 ENCOUNTER — Ambulatory Visit
Admission: RE | Admit: 2021-05-11 | Discharge: 2021-05-11 | Disposition: A | Discharge status: Home / Self Care | Payer: Managed Care, Other (non HMO) | Source: Ambulatory Visit | Attending: Podiatry | Admitting: Podiatry

## 2021-05-11 DIAGNOSIS — M76822 Posterior tibial tendinitis, left leg: Secondary | ICD-10-CM

## 2021-05-19 ENCOUNTER — Other Ambulatory Visit (HOSPITAL_BASED_OUTPATIENT_CLINIC_OR_DEPARTMENT_OTHER): Payer: Self-pay

## 2021-05-20 ENCOUNTER — Encounter (HOSPITAL_BASED_OUTPATIENT_CLINIC_OR_DEPARTMENT_OTHER): Payer: Self-pay

## 2021-05-20 ENCOUNTER — Other Ambulatory Visit (HOSPITAL_BASED_OUTPATIENT_CLINIC_OR_DEPARTMENT_OTHER): Payer: Self-pay

## 2021-05-20 MED ORDER — VITAMIN D (ERGOCALCIFEROL) 1.25 MG (50000 UNIT) PO CAPS
ORAL_CAPSULE | ORAL | 0 refills | Status: AC
  Filled 2021-05-20: qty 4, 28d supply, fill #0
  Filled 2021-06-21: qty 4, 28d supply, fill #1
  Filled 2021-11-28 – 2022-01-01 (×2): qty 4, 28d supply, fill #2

## 2021-05-20 MED ORDER — CONTRAVE 8-90 MG PO TB12
ORAL_TABLET | ORAL | 0 refills | Status: AC
Start: 2021-05-20 — End: ?
  Filled 2021-05-20: qty 70, 28d supply, fill #0
  Filled 2021-05-21: qty 70, 30d supply, fill #0
  Filled 2021-05-21 – 2021-06-04 (×2): qty 70, 28d supply, fill #0

## 2021-05-21 ENCOUNTER — Other Ambulatory Visit (HOSPITAL_BASED_OUTPATIENT_CLINIC_OR_DEPARTMENT_OTHER): Payer: Self-pay

## 2021-05-22 ENCOUNTER — Other Ambulatory Visit (HOSPITAL_BASED_OUTPATIENT_CLINIC_OR_DEPARTMENT_OTHER): Payer: Self-pay

## 2021-05-25 ENCOUNTER — Other Ambulatory Visit: Payer: Self-pay | Admitting: Internal Medicine

## 2021-05-25 ENCOUNTER — Other Ambulatory Visit (HOSPITAL_BASED_OUTPATIENT_CLINIC_OR_DEPARTMENT_OTHER): Payer: Self-pay

## 2021-05-25 DIAGNOSIS — E049 Nontoxic goiter, unspecified: Secondary | ICD-10-CM

## 2021-06-01 ENCOUNTER — Other Ambulatory Visit (HOSPITAL_BASED_OUTPATIENT_CLINIC_OR_DEPARTMENT_OTHER): Payer: Self-pay

## 2021-06-01 ENCOUNTER — Other Ambulatory Visit: Payer: Self-pay | Admitting: Internal Medicine

## 2021-06-01 DIAGNOSIS — E2839 Other primary ovarian failure: Secondary | ICD-10-CM

## 2021-06-02 ENCOUNTER — Other Ambulatory Visit (HOSPITAL_BASED_OUTPATIENT_CLINIC_OR_DEPARTMENT_OTHER): Payer: Self-pay

## 2021-06-04 ENCOUNTER — Other Ambulatory Visit (HOSPITAL_BASED_OUTPATIENT_CLINIC_OR_DEPARTMENT_OTHER): Payer: Self-pay

## 2021-06-17 ENCOUNTER — Other Ambulatory Visit: Payer: Commercial Managed Care - HMO

## 2021-06-19 ENCOUNTER — Ambulatory Visit: Payer: Commercial Managed Care - HMO | Admitting: Podiatry

## 2021-06-22 ENCOUNTER — Other Ambulatory Visit (HOSPITAL_BASED_OUTPATIENT_CLINIC_OR_DEPARTMENT_OTHER): Payer: Self-pay

## 2021-06-29 ENCOUNTER — Other Ambulatory Visit (HOSPITAL_BASED_OUTPATIENT_CLINIC_OR_DEPARTMENT_OTHER): Payer: Self-pay

## 2021-07-01 ENCOUNTER — Ambulatory Visit: Payer: Commercial Managed Care - HMO | Admitting: Podiatry

## 2021-07-02 ENCOUNTER — Ambulatory Visit: Payer: Commercial Managed Care - HMO | Admitting: Podiatry

## 2021-07-02 DIAGNOSIS — L603 Nail dystrophy: Secondary | ICD-10-CM

## 2021-07-23 ENCOUNTER — Other Ambulatory Visit (HOSPITAL_BASED_OUTPATIENT_CLINIC_OR_DEPARTMENT_OTHER): Payer: Self-pay

## 2021-07-23 ENCOUNTER — Other Ambulatory Visit: Payer: Self-pay | Admitting: Internal Medicine

## 2021-07-23 DIAGNOSIS — Z1231 Encounter for screening mammogram for malignant neoplasm of breast: Secondary | ICD-10-CM

## 2021-07-23 MED ORDER — OZEMPIC (0.25 OR 0.5 MG/DOSE) 2 MG/3ML ~~LOC~~ SOPN
PEN_INJECTOR | SUBCUTANEOUS | 0 refills | Status: DC
Start: 2021-07-23 — End: 2021-08-25
  Filled 2021-07-23: qty 3, 28d supply, fill #0

## 2021-07-23 MED ORDER — VITAMIN D (ERGOCALCIFEROL) 1.25 MG (50000 UNIT) PO CAPS
ORAL_CAPSULE | ORAL | 0 refills | Status: AC
  Filled 2021-07-23: qty 4, 28d supply, fill #0
  Filled 2021-09-21: qty 4, 28d supply, fill #1
  Filled 2021-10-16: qty 4, 28d supply, fill #2

## 2021-07-24 ENCOUNTER — Other Ambulatory Visit (HOSPITAL_BASED_OUTPATIENT_CLINIC_OR_DEPARTMENT_OTHER): Payer: Self-pay

## 2021-07-30 ENCOUNTER — Ambulatory Visit
Admission: RE | Admit: 2021-07-30 | Discharge: 2021-07-30 | Disposition: A | Discharge status: Home / Self Care | Payer: Commercial Managed Care - HMO | Source: Ambulatory Visit | Attending: Internal Medicine | Admitting: Internal Medicine

## 2021-07-30 DIAGNOSIS — E049 Nontoxic goiter, unspecified: Secondary | ICD-10-CM

## 2021-08-03 ENCOUNTER — Other Ambulatory Visit (HOSPITAL_BASED_OUTPATIENT_CLINIC_OR_DEPARTMENT_OTHER): Payer: Self-pay

## 2021-08-03 ENCOUNTER — Other Ambulatory Visit: Payer: Self-pay | Admitting: Internal Medicine

## 2021-08-03 DIAGNOSIS — E041 Nontoxic single thyroid nodule: Secondary | ICD-10-CM

## 2021-08-03 MED ORDER — FOLIC ACID 1 MG PO TABS
ORAL_TABLET | ORAL | 2 refills | Status: AC
Start: 2021-07-31 — End: ?
  Filled 2021-08-03: qty 30, 30d supply, fill #0
  Filled 2021-08-28: qty 90, 90d supply, fill #0
  Filled 2021-11-28: qty 90, 90d supply, fill #1

## 2021-08-12 ENCOUNTER — Other Ambulatory Visit: Payer: Self-pay | Admitting: Internal Medicine

## 2021-08-12 ENCOUNTER — Ambulatory Visit
Admission: RE | Admit: 2021-08-12 | Discharge: 2021-08-12 | Disposition: A | Discharge status: Home / Self Care | Payer: Commercial Managed Care - HMO | Source: Ambulatory Visit | Attending: Internal Medicine | Admitting: Internal Medicine

## 2021-08-12 DIAGNOSIS — E041 Nontoxic single thyroid nodule: Secondary | ICD-10-CM

## 2021-08-12 DIAGNOSIS — Z1231 Encounter for screening mammogram for malignant neoplasm of breast: Secondary | ICD-10-CM

## 2021-08-17 ENCOUNTER — Other Ambulatory Visit (HOSPITAL_BASED_OUTPATIENT_CLINIC_OR_DEPARTMENT_OTHER): Payer: Self-pay

## 2021-08-20 ENCOUNTER — Other Ambulatory Visit (HOSPITAL_COMMUNITY)
Admission: RE | Admit: 2021-08-20 | Discharge: 2021-08-20 | Disposition: A | Discharge status: Home / Self Care | Payer: Commercial Managed Care - HMO | Source: Ambulatory Visit | Attending: Internal Medicine | Admitting: Internal Medicine

## 2021-08-20 ENCOUNTER — Ambulatory Visit
Admission: RE | Admit: 2021-08-20 | Discharge: 2021-08-20 | Disposition: A | Discharge status: Home / Self Care | Payer: Commercial Managed Care - HMO | Source: Ambulatory Visit | Attending: Internal Medicine | Admitting: Internal Medicine

## 2021-08-20 DIAGNOSIS — E041 Nontoxic single thyroid nodule: Secondary | ICD-10-CM

## 2021-08-21 LAB — CYTOLOGY - NON PAP

## 2021-08-25 ENCOUNTER — Other Ambulatory Visit (HOSPITAL_BASED_OUTPATIENT_CLINIC_OR_DEPARTMENT_OTHER): Payer: Self-pay

## 2021-08-25 MED ORDER — HYDROCHLOROTHIAZIDE 12.5 MG PO CAPS
ORAL_CAPSULE | ORAL | 1 refills | Status: AC
  Filled 2021-08-25: qty 30, 30d supply, fill #0
  Filled 2021-11-28: qty 30, 30d supply, fill #1

## 2021-08-28 ENCOUNTER — Other Ambulatory Visit (HOSPITAL_BASED_OUTPATIENT_CLINIC_OR_DEPARTMENT_OTHER): Payer: Self-pay

## 2021-09-04 ENCOUNTER — Other Ambulatory Visit (HOSPITAL_BASED_OUTPATIENT_CLINIC_OR_DEPARTMENT_OTHER): Payer: Self-pay

## 2021-09-21 ENCOUNTER — Other Ambulatory Visit (HOSPITAL_BASED_OUTPATIENT_CLINIC_OR_DEPARTMENT_OTHER): Payer: Self-pay

## 2021-09-22 ENCOUNTER — Other Ambulatory Visit (HOSPITAL_BASED_OUTPATIENT_CLINIC_OR_DEPARTMENT_OTHER): Payer: Self-pay

## 2021-10-16 ENCOUNTER — Other Ambulatory Visit (HOSPITAL_BASED_OUTPATIENT_CLINIC_OR_DEPARTMENT_OTHER): Payer: Self-pay

## 2021-11-09 ENCOUNTER — Encounter (INDEPENDENT_AMBULATORY_CARE_PROVIDER_SITE_OTHER): Payer: Self-pay

## 2021-11-29 ENCOUNTER — Other Ambulatory Visit (HOSPITAL_BASED_OUTPATIENT_CLINIC_OR_DEPARTMENT_OTHER): Payer: Self-pay

## 2021-11-30 ENCOUNTER — Other Ambulatory Visit (HOSPITAL_BASED_OUTPATIENT_CLINIC_OR_DEPARTMENT_OTHER): Payer: Self-pay

## 2021-12-29 ENCOUNTER — Other Ambulatory Visit (HOSPITAL_BASED_OUTPATIENT_CLINIC_OR_DEPARTMENT_OTHER): Payer: Self-pay

## 2021-12-29 MED ORDER — LISINOPRIL-HYDROCHLOROTHIAZIDE 10-12.5 MG PO TABS
1.0000 | ORAL_TABLET | Freq: Every day | ORAL | 1 refills | Status: DC
  Filled 2021-12-29: qty 30, 30d supply, fill #0
  Filled 2022-02-01 – 2022-02-02 (×2): qty 30, 30d supply, fill #1

## 2022-01-01 ENCOUNTER — Ambulatory Visit
Admission: RE | Admit: 2022-01-01 | Discharge: 2022-01-01 | Disposition: A | Discharge status: Home / Self Care | Payer: Commercial Managed Care - HMO | Source: Ambulatory Visit | Attending: Internal Medicine | Admitting: Internal Medicine

## 2022-01-01 ENCOUNTER — Other Ambulatory Visit (HOSPITAL_BASED_OUTPATIENT_CLINIC_OR_DEPARTMENT_OTHER): Payer: Self-pay

## 2022-01-01 DIAGNOSIS — E2839 Other primary ovarian failure: Secondary | ICD-10-CM

## 2022-01-01 MED ORDER — COMIRNATY 30 MCG/0.3ML IM SUSY
PREFILLED_SYRINGE | INTRAMUSCULAR | 0 refills | Status: AC
  Filled 2022-01-01 – 2022-01-21 (×3): qty 0.3, 1d supply, fill #0

## 2022-01-21 ENCOUNTER — Other Ambulatory Visit (HOSPITAL_BASED_OUTPATIENT_CLINIC_OR_DEPARTMENT_OTHER): Payer: Self-pay

## 2022-01-25 ENCOUNTER — Other Ambulatory Visit (HOSPITAL_BASED_OUTPATIENT_CLINIC_OR_DEPARTMENT_OTHER): Payer: Self-pay

## 2022-01-27 ENCOUNTER — Other Ambulatory Visit (HOSPITAL_BASED_OUTPATIENT_CLINIC_OR_DEPARTMENT_OTHER): Payer: Self-pay

## 2022-01-28 ENCOUNTER — Other Ambulatory Visit (HOSPITAL_BASED_OUTPATIENT_CLINIC_OR_DEPARTMENT_OTHER): Payer: Self-pay

## 2022-01-28 ENCOUNTER — Encounter (HOSPITAL_BASED_OUTPATIENT_CLINIC_OR_DEPARTMENT_OTHER): Payer: Self-pay | Admitting: Pharmacist

## 2022-02-02 ENCOUNTER — Other Ambulatory Visit (HOSPITAL_BASED_OUTPATIENT_CLINIC_OR_DEPARTMENT_OTHER): Payer: Self-pay

## 2022-02-02 ENCOUNTER — Other Ambulatory Visit: Payer: Self-pay

## 2022-02-03 ENCOUNTER — Other Ambulatory Visit (HOSPITAL_BASED_OUTPATIENT_CLINIC_OR_DEPARTMENT_OTHER): Payer: Self-pay

## 2022-02-04 ENCOUNTER — Other Ambulatory Visit (HOSPITAL_BASED_OUTPATIENT_CLINIC_OR_DEPARTMENT_OTHER): Payer: Self-pay

## 2022-02-04 MED ORDER — ERGOCALCIFEROL 1.25 MG (50000 UT) PO CAPS
ORAL_CAPSULE | ORAL | 0 refills | Status: DC
  Filled 2022-02-04: qty 4, 28d supply, fill #0
  Filled 2022-02-28 – 2022-03-11 (×2): qty 4, 28d supply, fill #1

## 2022-02-28 ENCOUNTER — Other Ambulatory Visit (HOSPITAL_BASED_OUTPATIENT_CLINIC_OR_DEPARTMENT_OTHER): Payer: Self-pay

## 2022-03-01 ENCOUNTER — Other Ambulatory Visit (HOSPITAL_BASED_OUTPATIENT_CLINIC_OR_DEPARTMENT_OTHER): Payer: Self-pay

## 2022-03-01 ENCOUNTER — Other Ambulatory Visit: Payer: Self-pay

## 2022-03-01 MED ORDER — LISINOPRIL-HYDROCHLOROTHIAZIDE 10-12.5 MG PO TABS
1.0000 | ORAL_TABLET | Freq: Every day | ORAL | 1 refills | Status: DC
  Filled 2022-03-01 – 2022-03-11 (×2): qty 30, 30d supply, fill #0
  Filled 2022-05-26: qty 30, 30d supply, fill #1
  Filled ????-??-??: fill #1

## 2022-03-05 ENCOUNTER — Other Ambulatory Visit (HOSPITAL_BASED_OUTPATIENT_CLINIC_OR_DEPARTMENT_OTHER): Payer: Self-pay

## 2022-03-05 ENCOUNTER — Encounter (HOSPITAL_BASED_OUTPATIENT_CLINIC_OR_DEPARTMENT_OTHER): Payer: Self-pay

## 2022-03-08 ENCOUNTER — Other Ambulatory Visit (HOSPITAL_BASED_OUTPATIENT_CLINIC_OR_DEPARTMENT_OTHER): Payer: Self-pay

## 2022-03-11 ENCOUNTER — Other Ambulatory Visit (HOSPITAL_BASED_OUTPATIENT_CLINIC_OR_DEPARTMENT_OTHER): Payer: Self-pay

## 2022-03-12 ENCOUNTER — Other Ambulatory Visit (HOSPITAL_BASED_OUTPATIENT_CLINIC_OR_DEPARTMENT_OTHER): Payer: Self-pay

## 2022-04-01 ENCOUNTER — Other Ambulatory Visit (HOSPITAL_BASED_OUTPATIENT_CLINIC_OR_DEPARTMENT_OTHER): Payer: Self-pay

## 2022-04-01 MED ORDER — CYANOCOBALAMIN 1000 MCG/ML IJ SOLN
1000.0000 ug | INTRAMUSCULAR | 1 refills | Status: AC
  Filled 2022-04-01: qty 1, 30d supply, fill #0
  Filled 2022-05-15: qty 1, 30d supply, fill #1
  Filled 2022-06-13 – 2022-08-15 (×2): qty 1, 30d supply, fill #2
  Filled 2022-10-12: qty 1, 30d supply, fill #3
  Filled 2022-11-27: qty 1, 30d supply, fill #4
  Filled 2023-03-26: qty 1, 30d supply, fill #5

## 2022-04-02 ENCOUNTER — Other Ambulatory Visit (HOSPITAL_BASED_OUTPATIENT_CLINIC_OR_DEPARTMENT_OTHER): Payer: Self-pay

## 2022-04-05 ENCOUNTER — Other Ambulatory Visit (HOSPITAL_BASED_OUTPATIENT_CLINIC_OR_DEPARTMENT_OTHER): Payer: Self-pay

## 2022-04-05 MED ORDER — VITAMIN D (ERGOCALCIFEROL) 1.25 MG (50000 UNIT) PO CAPS
50000.0000 [IU] | ORAL_CAPSULE | ORAL | 0 refills | Status: DC
  Filled 2022-06-13 – 2022-08-15 (×2): qty 8, 56d supply, fill #0

## 2022-04-09 ENCOUNTER — Other Ambulatory Visit (HOSPITAL_BASED_OUTPATIENT_CLINIC_OR_DEPARTMENT_OTHER): Payer: Self-pay

## 2022-05-16 ENCOUNTER — Other Ambulatory Visit (HOSPITAL_BASED_OUTPATIENT_CLINIC_OR_DEPARTMENT_OTHER): Payer: Self-pay

## 2022-05-17 ENCOUNTER — Other Ambulatory Visit (HOSPITAL_BASED_OUTPATIENT_CLINIC_OR_DEPARTMENT_OTHER): Payer: Self-pay

## 2022-06-13 ENCOUNTER — Other Ambulatory Visit (HOSPITAL_BASED_OUTPATIENT_CLINIC_OR_DEPARTMENT_OTHER): Payer: Self-pay

## 2022-06-14 ENCOUNTER — Other Ambulatory Visit: Payer: Self-pay

## 2022-06-28 ENCOUNTER — Other Ambulatory Visit (HOSPITAL_BASED_OUTPATIENT_CLINIC_OR_DEPARTMENT_OTHER): Payer: Self-pay

## 2022-07-23 ENCOUNTER — Other Ambulatory Visit (HOSPITAL_BASED_OUTPATIENT_CLINIC_OR_DEPARTMENT_OTHER): Payer: Self-pay

## 2022-07-23 MED ORDER — LISINOPRIL-HYDROCHLOROTHIAZIDE 10-12.5 MG PO TABS
1.0000 | ORAL_TABLET | Freq: Every day | ORAL | 4 refills | Status: DC
  Filled 2022-07-23: qty 30, 30d supply, fill #0
  Filled 2022-10-12: qty 30, 30d supply, fill #1
  Filled 2022-11-27: qty 30, 30d supply, fill #2
  Filled 2023-02-17: qty 30, 30d supply, fill #3
  Filled 2023-03-26 – 2023-04-01 (×2): qty 30, 30d supply, fill #4

## 2022-07-25 ENCOUNTER — Ambulatory Visit (HOSPITAL_BASED_OUTPATIENT_CLINIC_OR_DEPARTMENT_OTHER)
Admission: RE | Admit: 2022-07-25 | Discharge: 2022-07-25 | Disposition: A | Discharge status: Home / Self Care | Payer: Commercial Managed Care - HMO | Source: Ambulatory Visit | Attending: Obstetrics and Gynecology | Admitting: Obstetrics and Gynecology

## 2022-07-25 DIAGNOSIS — Z1231 Encounter for screening mammogram for malignant neoplasm of breast: Secondary | ICD-10-CM | POA: Diagnosis present

## 2022-08-15 ENCOUNTER — Encounter (HOSPITAL_BASED_OUTPATIENT_CLINIC_OR_DEPARTMENT_OTHER): Payer: Self-pay

## 2022-08-15 ENCOUNTER — Other Ambulatory Visit (HOSPITAL_BASED_OUTPATIENT_CLINIC_OR_DEPARTMENT_OTHER): Payer: Self-pay

## 2022-10-04 ENCOUNTER — Other Ambulatory Visit (HOSPITAL_BASED_OUTPATIENT_CLINIC_OR_DEPARTMENT_OTHER): Payer: Self-pay

## 2022-10-12 ENCOUNTER — Other Ambulatory Visit: Payer: Self-pay

## 2022-10-12 ENCOUNTER — Other Ambulatory Visit (HOSPITAL_BASED_OUTPATIENT_CLINIC_OR_DEPARTMENT_OTHER): Payer: Self-pay

## 2022-10-15 ENCOUNTER — Other Ambulatory Visit (HOSPITAL_BASED_OUTPATIENT_CLINIC_OR_DEPARTMENT_OTHER): Payer: Self-pay

## 2022-10-15 MED ORDER — COVID-19 MRNA VAC-TRIS(PFIZER) 30 MCG/0.3ML IM SUSY
0.3000 mL | PREFILLED_SYRINGE | Freq: Once | INTRAMUSCULAR | 0 refills | Status: AC
  Filled 2022-10-15: qty 0.3, 1d supply, fill #0

## 2022-11-04 ENCOUNTER — Other Ambulatory Visit (HOSPITAL_BASED_OUTPATIENT_CLINIC_OR_DEPARTMENT_OTHER): Payer: Self-pay

## 2022-11-04 MED ORDER — FUSION PLUS PO CAPS
1.0000 | ORAL_CAPSULE | Freq: Every day | ORAL | 2 refills | Status: AC
  Filled 2022-11-04 – 2023-04-01 (×3): qty 30, 30d supply, fill #0

## 2022-11-16 ENCOUNTER — Other Ambulatory Visit (HOSPITAL_BASED_OUTPATIENT_CLINIC_OR_DEPARTMENT_OTHER): Payer: Self-pay

## 2022-11-27 ENCOUNTER — Other Ambulatory Visit (HOSPITAL_BASED_OUTPATIENT_CLINIC_OR_DEPARTMENT_OTHER): Payer: Self-pay

## 2022-11-29 ENCOUNTER — Other Ambulatory Visit: Payer: Self-pay

## 2022-12-31 ENCOUNTER — Other Ambulatory Visit (HOSPITAL_BASED_OUTPATIENT_CLINIC_OR_DEPARTMENT_OTHER): Payer: Self-pay

## 2022-12-31 MED ORDER — BETAMETHASONE DIPROPIONATE AUG 0.05 % EX OINT
1.0000 | TOPICAL_OINTMENT | Freq: Two times a day (BID) | CUTANEOUS | 1 refills | Status: AC
  Filled 2022-12-31 – 2023-01-28 (×3): qty 50, 25d supply, fill #0
  Filled 2023-02-11: qty 50, 30d supply, fill #0

## 2023-01-03 ENCOUNTER — Other Ambulatory Visit: Payer: Self-pay

## 2023-01-03 ENCOUNTER — Other Ambulatory Visit (HOSPITAL_BASED_OUTPATIENT_CLINIC_OR_DEPARTMENT_OTHER): Payer: Self-pay

## 2023-01-17 ENCOUNTER — Encounter (HOSPITAL_BASED_OUTPATIENT_CLINIC_OR_DEPARTMENT_OTHER): Payer: Self-pay

## 2023-01-17 ENCOUNTER — Other Ambulatory Visit (HOSPITAL_BASED_OUTPATIENT_CLINIC_OR_DEPARTMENT_OTHER): Payer: Self-pay

## 2023-01-17 MED ORDER — VITAMIN D (ERGOCALCIFEROL) 1.25 MG (50000 UNIT) PO CAPS
50000.0000 [IU] | ORAL_CAPSULE | ORAL | 0 refills | Status: DC
  Filled 2023-01-17 – 2023-01-28 (×2): qty 8, 56d supply, fill #0

## 2023-01-27 ENCOUNTER — Other Ambulatory Visit (HOSPITAL_BASED_OUTPATIENT_CLINIC_OR_DEPARTMENT_OTHER): Payer: Self-pay

## 2023-01-28 ENCOUNTER — Other Ambulatory Visit (HOSPITAL_BASED_OUTPATIENT_CLINIC_OR_DEPARTMENT_OTHER): Payer: Self-pay

## 2023-02-10 ENCOUNTER — Other Ambulatory Visit (HOSPITAL_BASED_OUTPATIENT_CLINIC_OR_DEPARTMENT_OTHER): Payer: Self-pay

## 2023-02-10 ENCOUNTER — Encounter (INDEPENDENT_AMBULATORY_CARE_PROVIDER_SITE_OTHER): Payer: Self-pay

## 2023-02-11 ENCOUNTER — Other Ambulatory Visit (HOSPITAL_BASED_OUTPATIENT_CLINIC_OR_DEPARTMENT_OTHER): Payer: Self-pay

## 2023-02-17 ENCOUNTER — Other Ambulatory Visit (HOSPITAL_BASED_OUTPATIENT_CLINIC_OR_DEPARTMENT_OTHER): Payer: Self-pay

## 2023-02-18 ENCOUNTER — Other Ambulatory Visit (HOSPITAL_BASED_OUTPATIENT_CLINIC_OR_DEPARTMENT_OTHER): Payer: Self-pay

## 2023-02-18 MED ORDER — MELOXICAM 15 MG PO TABS
15.0000 mg | ORAL_TABLET | Freq: Every day | ORAL | 1 refills | Status: AC
  Filled 2023-02-18: qty 30, 30d supply, fill #0

## 2023-03-04 ENCOUNTER — Other Ambulatory Visit (HOSPITAL_BASED_OUTPATIENT_CLINIC_OR_DEPARTMENT_OTHER): Payer: Self-pay

## 2023-03-04 MED ORDER — CYANOCOBALAMIN 1000 MCG/ML IJ SOLN
INTRAMUSCULAR | 1 refills | Status: AC
  Filled 2023-03-04: qty 3, 84d supply, fill #0
  Filled 2023-08-12: qty 3, 84d supply, fill #1
  Filled 2024-01-08: qty 2, 60d supply, fill #2

## 2023-03-08 ENCOUNTER — Encounter (INDEPENDENT_AMBULATORY_CARE_PROVIDER_SITE_OTHER): Payer: Self-pay

## 2023-03-18 ENCOUNTER — Other Ambulatory Visit (HOSPITAL_BASED_OUTPATIENT_CLINIC_OR_DEPARTMENT_OTHER): Payer: Self-pay

## 2023-03-18 MED ORDER — VITAMIN D (ERGOCALCIFEROL) 1.25 MG (50000 UNIT) PO CAPS
50000.0000 [IU] | ORAL_CAPSULE | ORAL | 0 refills | Status: DC
  Filled 2023-03-18 – 2023-04-01 (×2): qty 8, 56d supply, fill #0

## 2023-03-26 ENCOUNTER — Other Ambulatory Visit (HOSPITAL_BASED_OUTPATIENT_CLINIC_OR_DEPARTMENT_OTHER): Payer: Self-pay

## 2023-03-26 ENCOUNTER — Encounter (HOSPITAL_BASED_OUTPATIENT_CLINIC_OR_DEPARTMENT_OTHER): Payer: Self-pay

## 2023-03-28 ENCOUNTER — Other Ambulatory Visit (HOSPITAL_BASED_OUTPATIENT_CLINIC_OR_DEPARTMENT_OTHER): Payer: Self-pay

## 2023-03-28 ENCOUNTER — Other Ambulatory Visit: Payer: Self-pay

## 2023-04-01 ENCOUNTER — Other Ambulatory Visit (HOSPITAL_BASED_OUTPATIENT_CLINIC_OR_DEPARTMENT_OTHER): Payer: Self-pay

## 2023-04-01 MED ORDER — BOOSTRIX 5-2.5-18.5 LF-MCG/0.5 IM SUSY
PREFILLED_SYRINGE | INTRAMUSCULAR | 0 refills | Status: AC
  Filled 2023-04-01: qty 0.5, 1d supply, fill #0

## 2023-05-20 ENCOUNTER — Other Ambulatory Visit (HOSPITAL_BASED_OUTPATIENT_CLINIC_OR_DEPARTMENT_OTHER): Payer: Self-pay

## 2023-05-25 ENCOUNTER — Other Ambulatory Visit (HOSPITAL_BASED_OUTPATIENT_CLINIC_OR_DEPARTMENT_OTHER): Payer: Self-pay

## 2023-05-25 MED ORDER — VITAMIN D (ERGOCALCIFEROL) 1.25 MG (50000 UNIT) PO CAPS
ORAL_CAPSULE | ORAL | 0 refills | Status: AC
  Filled 2023-05-25: qty 4, 28d supply, fill #0

## 2023-06-03 ENCOUNTER — Other Ambulatory Visit (HOSPITAL_BASED_OUTPATIENT_CLINIC_OR_DEPARTMENT_OTHER): Payer: Self-pay

## 2023-06-03 MED ORDER — CEFDINIR 300 MG PO CAPS
300.0000 mg | ORAL_CAPSULE | Freq: Two times a day (BID) | ORAL | 0 refills | Status: AC
  Filled 2023-06-03: qty 20, 10d supply, fill #0

## 2023-06-24 ENCOUNTER — Other Ambulatory Visit (HOSPITAL_BASED_OUTPATIENT_CLINIC_OR_DEPARTMENT_OTHER): Payer: Self-pay

## 2023-06-25 ENCOUNTER — Other Ambulatory Visit (HOSPITAL_BASED_OUTPATIENT_CLINIC_OR_DEPARTMENT_OTHER): Payer: Self-pay

## 2023-06-27 ENCOUNTER — Other Ambulatory Visit (HOSPITAL_BASED_OUTPATIENT_CLINIC_OR_DEPARTMENT_OTHER): Payer: Self-pay

## 2023-06-28 ENCOUNTER — Other Ambulatory Visit (HOSPITAL_BASED_OUTPATIENT_CLINIC_OR_DEPARTMENT_OTHER): Payer: Self-pay

## 2023-06-28 ENCOUNTER — Encounter (HOSPITAL_BASED_OUTPATIENT_CLINIC_OR_DEPARTMENT_OTHER): Payer: Self-pay

## 2023-08-12 ENCOUNTER — Other Ambulatory Visit: Payer: Self-pay

## 2023-08-12 ENCOUNTER — Other Ambulatory Visit (HOSPITAL_BASED_OUTPATIENT_CLINIC_OR_DEPARTMENT_OTHER): Payer: Self-pay

## 2023-08-12 MED ORDER — LISINOPRIL-HYDROCHLOROTHIAZIDE 10-12.5 MG PO TABS
1.0000 | ORAL_TABLET | Freq: Every day | ORAL | 4 refills | Status: AC
  Filled 2023-08-12: qty 90, 90d supply, fill #0
  Filled 2024-01-08 – 2024-02-01 (×3): qty 90, 90d supply, fill #1

## 2023-10-11 ENCOUNTER — Other Ambulatory Visit (HOSPITAL_BASED_OUTPATIENT_CLINIC_OR_DEPARTMENT_OTHER): Payer: Self-pay

## 2023-10-11 ENCOUNTER — Other Ambulatory Visit: Payer: Self-pay

## 2023-10-11 MED ORDER — VITAMIN D (ERGOCALCIFEROL) 1.25 MG (50000 UNIT) PO CAPS
50000.0000 [IU] | ORAL_CAPSULE | ORAL | 1 refills | Status: DC
  Filled 2023-10-11: qty 4, 28d supply, fill #0
  Filled 2023-12-09: qty 4, 28d supply, fill #1

## 2023-10-11 MED ORDER — LISINOPRIL-HYDROCHLOROTHIAZIDE 10-12.5 MG PO TABS
1.0000 | ORAL_TABLET | Freq: Every day | ORAL | 2 refills | Status: AC
  Filled 2023-10-11 – 2023-12-09 (×2): qty 30, 30d supply, fill #0

## 2023-10-14 ENCOUNTER — Other Ambulatory Visit (HOSPITAL_BASED_OUTPATIENT_CLINIC_OR_DEPARTMENT_OTHER): Payer: Self-pay | Admitting: Obstetrics and Gynecology

## 2023-10-14 ENCOUNTER — Ambulatory Visit (HOSPITAL_BASED_OUTPATIENT_CLINIC_OR_DEPARTMENT_OTHER)
Admission: RE | Admit: 2023-10-14 | Discharge: 2023-10-14 | Disposition: A | Discharge status: Home / Self Care | Source: Ambulatory Visit

## 2023-10-14 ENCOUNTER — Encounter (HOSPITAL_BASED_OUTPATIENT_CLINIC_OR_DEPARTMENT_OTHER): Payer: Self-pay | Admitting: Radiology

## 2023-10-14 DIAGNOSIS — Z1231 Encounter for screening mammogram for malignant neoplasm of breast: Secondary | ICD-10-CM

## 2023-12-09 ENCOUNTER — Other Ambulatory Visit (HOSPITAL_BASED_OUTPATIENT_CLINIC_OR_DEPARTMENT_OTHER): Payer: Self-pay

## 2023-12-14 ENCOUNTER — Other Ambulatory Visit (HOSPITAL_BASED_OUTPATIENT_CLINIC_OR_DEPARTMENT_OTHER): Payer: Self-pay

## 2023-12-16 ENCOUNTER — Other Ambulatory Visit (HOSPITAL_BASED_OUTPATIENT_CLINIC_OR_DEPARTMENT_OTHER): Payer: Self-pay

## 2024-01-04 ENCOUNTER — Other Ambulatory Visit (HOSPITAL_BASED_OUTPATIENT_CLINIC_OR_DEPARTMENT_OTHER): Payer: Self-pay

## 2024-01-08 ENCOUNTER — Other Ambulatory Visit (HOSPITAL_BASED_OUTPATIENT_CLINIC_OR_DEPARTMENT_OTHER): Payer: Self-pay

## 2024-01-09 ENCOUNTER — Other Ambulatory Visit (HOSPITAL_BASED_OUTPATIENT_CLINIC_OR_DEPARTMENT_OTHER): Payer: Self-pay

## 2024-01-09 ENCOUNTER — Other Ambulatory Visit: Payer: Self-pay

## 2024-01-09 MED ORDER — VITAMIN D (ERGOCALCIFEROL) 1.25 MG (50000 UNIT) PO CAPS
50000.0000 [IU] | ORAL_CAPSULE | ORAL | 1 refills | Status: AC
  Filled 2024-01-09 – 2024-01-21 (×2): qty 4, 28d supply, fill #0
  Filled 2024-02-01 – 2024-02-03 (×2): qty 4, 28d supply, fill #1

## 2024-01-19 ENCOUNTER — Other Ambulatory Visit (HOSPITAL_BASED_OUTPATIENT_CLINIC_OR_DEPARTMENT_OTHER): Payer: Self-pay

## 2024-01-21 ENCOUNTER — Other Ambulatory Visit (HOSPITAL_BASED_OUTPATIENT_CLINIC_OR_DEPARTMENT_OTHER): Payer: Self-pay

## 2024-01-30 ENCOUNTER — Other Ambulatory Visit (HOSPITAL_BASED_OUTPATIENT_CLINIC_OR_DEPARTMENT_OTHER): Payer: Self-pay

## 2024-02-02 ENCOUNTER — Other Ambulatory Visit (HOSPITAL_BASED_OUTPATIENT_CLINIC_OR_DEPARTMENT_OTHER): Payer: Self-pay

## 2024-02-02 ENCOUNTER — Other Ambulatory Visit: Payer: Self-pay

## 2024-02-03 ENCOUNTER — Other Ambulatory Visit (HOSPITAL_BASED_OUTPATIENT_CLINIC_OR_DEPARTMENT_OTHER): Payer: Self-pay
